# Patient Record
Sex: Male | Born: 1960
Health system: Southern US, Community
[De-identification: ages and names within clinical notes are randomized; demographics above are authoritative.]

---

## 1998-01-01 ENCOUNTER — Ambulatory Visit (HOSPITAL_COMMUNITY): Admission: RE | Admit: 1998-01-01 | Discharge: 1998-01-01 | Payer: Self-pay | Admitting: Urology

## 1998-01-01 ENCOUNTER — Encounter: Payer: Self-pay | Admitting: Urology

## 1998-04-17 ENCOUNTER — Encounter: Payer: Self-pay | Admitting: Family Medicine

## 1998-04-17 ENCOUNTER — Ambulatory Visit (HOSPITAL_COMMUNITY): Admission: RE | Admit: 1998-04-17 | Discharge: 1998-04-17 | Payer: Self-pay | Admitting: Family Medicine

## 1998-12-18 ENCOUNTER — Ambulatory Visit (HOSPITAL_COMMUNITY): Admission: RE | Admit: 1998-12-18 | Discharge: 1998-12-18 | Payer: Self-pay | Admitting: *Deleted

## 1998-12-18 ENCOUNTER — Encounter: Payer: Self-pay | Admitting: *Deleted

## 1999-04-08 ENCOUNTER — Other Ambulatory Visit: Admission: RE | Admit: 1999-04-08 | Discharge: 1999-04-08 | Payer: Self-pay | Admitting: Family Medicine

## 2002-02-20 HISTORY — PX: KNEE ARTHROSCOPY: SUR90

## 2002-08-01 ENCOUNTER — Encounter: Payer: Self-pay | Admitting: Occupational Medicine

## 2002-08-01 ENCOUNTER — Encounter: Admission: RE | Admit: 2002-08-01 | Discharge: 2002-08-01 | Payer: Self-pay | Admitting: Occupational Medicine

## 2004-02-16 ENCOUNTER — Ambulatory Visit (HOSPITAL_COMMUNITY): Admission: RE | Admit: 2004-02-16 | Discharge: 2004-02-16 | Payer: Self-pay | Admitting: Orthopedic Surgery

## 2004-03-22 ENCOUNTER — Ambulatory Visit (HOSPITAL_COMMUNITY): Admission: RE | Admit: 2004-03-22 | Discharge: 2004-03-22 | Payer: Self-pay | Admitting: Gastroenterology

## 2004-03-31 ENCOUNTER — Emergency Department (HOSPITAL_COMMUNITY): Admission: EM | Admit: 2004-03-31 | Discharge: 2004-03-31 | Payer: Self-pay | Admitting: Emergency Medicine

## 2010-02-20 HISTORY — PX: HAND SURGERY: SHX662

## 2010-03-13 ENCOUNTER — Encounter: Payer: Self-pay | Admitting: Family Medicine

## 2010-04-13 ENCOUNTER — Emergency Department (HOSPITAL_COMMUNITY): Payer: BC Managed Care – PPO

## 2010-04-13 ENCOUNTER — Emergency Department (HOSPITAL_COMMUNITY)
Admission: EM | Admit: 2010-04-13 | Discharge: 2010-04-13 | Disposition: A | Discharge status: Home / Self Care | Payer: BC Managed Care – PPO | Attending: Emergency Medicine | Admitting: Emergency Medicine

## 2010-04-13 DIAGNOSIS — S93409A Sprain of unspecified ligament of unspecified ankle, initial encounter: Secondary | ICD-10-CM | POA: Insufficient documentation

## 2010-04-13 DIAGNOSIS — X500XXA Overexertion from strenuous movement or load, initial encounter: Secondary | ICD-10-CM | POA: Insufficient documentation

## 2013-08-12 ENCOUNTER — Encounter: Payer: Self-pay | Admitting: Emergency Medicine

## 2013-08-12 ENCOUNTER — Encounter (HOSPITAL_COMMUNITY): Payer: Self-pay | Admitting: Emergency Medicine

## 2013-08-12 ENCOUNTER — Emergency Department (HOSPITAL_COMMUNITY): Payer: BC Managed Care – PPO

## 2013-08-12 ENCOUNTER — Emergency Department (HOSPITAL_COMMUNITY)
Admission: EM | Admit: 2013-08-12 | Discharge: 2013-08-12 | Disposition: A | Discharge status: Home / Self Care | Payer: BC Managed Care – PPO | Attending: Emergency Medicine | Admitting: Emergency Medicine

## 2013-08-12 ENCOUNTER — Ambulatory Visit (INDEPENDENT_AMBULATORY_CARE_PROVIDER_SITE_OTHER): Payer: BC Managed Care – PPO | Admitting: Emergency Medicine

## 2013-08-12 VITALS — BP 143/72 | HR 66 | Temp 97.7°F

## 2013-08-12 DIAGNOSIS — R079 Chest pain, unspecified: Secondary | ICD-10-CM | POA: Insufficient documentation

## 2013-08-12 DIAGNOSIS — R110 Nausea: Secondary | ICD-10-CM

## 2013-08-12 DIAGNOSIS — I200 Unstable angina: Secondary | ICD-10-CM

## 2013-08-12 LAB — PROTIME-INR
INR: 1.09 (ref 0.00–1.49)
PROTHROMBIN TIME: 13.9 s (ref 11.6–15.2)

## 2013-08-12 LAB — COMPREHENSIVE METABOLIC PANEL WITH GFR
ALT: 16 U/L (ref 0–53)
AST: 18 U/L (ref 0–37)
Albumin: 3.6 g/dL (ref 3.5–5.2)
Alkaline Phosphatase: 49 U/L (ref 39–117)
BUN: 15 mg/dL (ref 6–23)
CO2: 25 meq/L (ref 19–32)
Calcium: 8.7 mg/dL (ref 8.4–10.5)
Chloride: 107 meq/L (ref 96–112)
Creatinine, Ser: 1.1 mg/dL (ref 0.50–1.35)
GFR calc Af Amer: 87 mL/min — ABNORMAL LOW
GFR calc non Af Amer: 75 mL/min — ABNORMAL LOW
Glucose, Bld: 95 mg/dL (ref 70–99)
Potassium: 4.2 meq/L (ref 3.7–5.3)
Sodium: 143 meq/L (ref 137–147)
Total Bilirubin: 0.5 mg/dL (ref 0.3–1.2)
Total Protein: 6.4 g/dL (ref 6.0–8.3)

## 2013-08-12 LAB — GLUCOSE, POCT (MANUAL RESULT ENTRY): POC GLUCOSE: 152 mg/dL — AB (ref 70–99)

## 2013-08-12 LAB — I-STAT TROPONIN, ED
Troponin i, poc: 0 ng/mL (ref 0.00–0.08)
Troponin i, poc: 0 ng/mL (ref 0.00–0.08)

## 2013-08-12 LAB — CBC
HCT: 39.7 % (ref 39.0–52.0)
Hemoglobin: 13.8 g/dL (ref 13.0–17.0)
MCH: 30.5 pg (ref 26.0–34.0)
MCHC: 34.8 g/dL (ref 30.0–36.0)
MCV: 87.8 fL (ref 78.0–100.0)
Platelets: 243 10*3/uL (ref 150–400)
RBC: 4.52 MIL/uL (ref 4.22–5.81)
RDW: 12.6 % (ref 11.5–15.5)
WBC: 9.3 10*3/uL (ref 4.0–10.5)

## 2013-08-12 LAB — APTT: aPTT: 29 seconds (ref 24–37)

## 2013-08-12 MED ORDER — NITROGLYCERIN 0.3 MG SL SUBL
0.4000 mg | SUBLINGUAL_TABLET | Freq: Once | SUBLINGUAL | Status: AC
  Administered 2013-08-12: 0.3 mg via SUBLINGUAL

## 2013-08-12 MED ORDER — ONDANSETRON 4 MG PO TBDP
4.0000 mg | ORAL_TABLET | Freq: Once | ORAL | Status: AC
  Administered 2013-08-12: 4 mg via ORAL

## 2013-08-12 MED ORDER — ASPIRIN 81 MG PO CHEW
81.0000 mg | CHEWABLE_TABLET | Freq: Once | ORAL | Status: AC
  Administered 2013-08-12: 81 mg via ORAL

## 2013-08-12 MED ORDER — ASPIRIN 81 MG PO CHEW
81.0000 mg | CHEWABLE_TABLET | Freq: Once | ORAL | Status: AC
Start: 2013-08-12 — End: 2013-08-12
  Administered 2013-08-12: 81 mg via ORAL

## 2013-08-12 MED ORDER — SODIUM CHLORIDE 0.9 % IV SOLN
1000.0000 mL | INTRAVENOUS | Status: DC
  Administered 2013-08-12: 1000 mL via INTRAVENOUS

## 2013-08-12 NOTE — ED Notes (Signed)
Pt verbalizes understanding of d/c instructions and denies any further needs at this time. 

## 2013-08-12 NOTE — ED Notes (Addendum)
Per EMS: Pt reports constant left sided chest pain that gets better and worse, but never completley goes away x months. Today at work pt had pain radiate to left jaw and left arm for the first time. Pt reports dizziness, nausea and generalized weakness. Pt given 2 nitro, 325 aspirin, 1 liter of fluid. Pt now chest pain, but reports continued fatigue. AO x4. VSS. NSR.

## 2013-08-12 NOTE — Discharge Instructions (Signed)

## 2013-08-12 NOTE — ED Notes (Signed)
Md Tomi Bamberger at bedside.

## 2013-08-12 NOTE — ED Provider Notes (Signed)
CSN: [id number]     Arrival date & time 08/12/13  1341 History   First MD Initiated Contact with Patient 08/12/13 1341     Chief Complaint  Patient presents with  . Chest Pain  . Fatigue    HPI Pt has been having pain in his chest for months.  The episodes are intermittent.  Last 20-30 minutes.  He would just continue his work and never thought much of it.  It is a burning in the center of his chest.   No shortness of breath. No diaphoresis.  Pt works hard every day.  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  Nothing in particular brings it on.  Sometimes he gets the pain while working, sometimes he doesn't.  He does find that being in the heat makes it worse. Today he had an episode and he felt very dizzy and lightheaded.  The symptoms have resolved now.  He did have pain in his neck and arm when this occurred.  It lasted maybe for seconds.  He was seen at an urgent care and was given asa and ntg which helped.    He was working in the heat when this happened. No history of heart or lung disease.   Mom has had cardiac stents but this occurred in her 35s. History reviewed. No pertinent past medical history. History reviewed. No pertinent past surgical history. Family History  Problem Relation Age of Onset  . Asthma Father    History  Substance Use Topics  . Smoking status: Never Smoker   . Smokeless tobacco: Not on file  . Alcohol Use: No    Review of Systems  All other systems reviewed and are negative.     Allergies  Review of patient's allergies indicates no known allergies.  Home Medications   Prior to Admission medications   Not on File   BP 126/83  Pulse 59  Temp(Src) 97.7 F (36.5 C) (Oral)  Resp 16  Ht 6' (1.829 m)  Wt 220 lb (99.791 kg)  BMI 29.83 kg/m2  SpO2 100% Physical Exam  Nursing note and vitals reviewed. Constitutional: He appears well-developed and well-nourished. No distress.  HENT:  Head: Normocephalic and atraumatic.   Right Ear: External ear normal.  Left Ear: External ear normal.  Eyes: Conjunctivae are normal. Right eye exhibits no discharge. Left eye exhibits no discharge. No scleral icterus.  Neck: Neck supple. No tracheal deviation present.  Cardiovascular: Normal rate, regular rhythm and intact distal pulses.   Pulmonary/Chest: Effort normal and breath sounds normal. No stridor. No respiratory distress. He has no wheezes. He has no rales.  Abdominal: Soft. Bowel sounds are normal. He exhibits no distension. There is no tenderness. There is no rebound and no guarding.  Musculoskeletal: He exhibits no edema and no tenderness.  Neurological: He is alert. He has normal strength. No cranial nerve deficit (no facial droop, extraocular movements intact, no slurred speech) or sensory deficit. He exhibits normal muscle tone. He displays no seizure activity. Coordination normal.  Skin: Skin is warm and dry. No rash noted.  Psychiatric: He has a normal mood and affect.    ED Course  Procedures (including critical care time) Labs Review Labs Reviewed  COMPREHENSIVE METABOLIC PANEL - Abnormal; Notable for the following:    GFR calc non Af Amer 75 (*)    GFR calc Af Amer 87 (*)    All other components within normal limits  APTT  CBC  PROTIME-INR  Randolm Idol, ED  Randolm Idol, ED    Imaging Review Dg Chest Portable 1 View  08/12/2013   CLINICAL DATA:  Chest pain.  Fatigue.  EXAM: PORTABLE CHEST - 1 VIEW  COMPARISON:  06/17/2008.  FINDINGS: Cardiopericardial silhouette within normal limits. Mediastinal contours normal. Trachea midline. No airspace disease or effusion. Monitoring leads project over the chest.  IMPRESSION: No active disease.   Electronically Signed   By: Dereck Ligas M.D.   On: 08/12/2013 14:33     EKG Interpretation   Date/Time:  Tuesday August 12 2013 13:47:24 EDT Ventricular Rate:  60 PR Interval:  176 QRS Duration: 106 QT Interval:  417 QTC Calculation: 417 R Axis:    -4 Text Interpretation:  Sinus rhythm No old tracing to compare Confirmed by  KNAPP  MD-J, JON ([id number]) on 08/12/2013 2:00:46 PM      MDM   Final diagnoses:  Chest pain, unspecified chest pain type    Pt has been having intermittent chest pain for months.  TIMI score 0.  No family history of CAD.   Pt has a normal EKG and 2 sets of normal cardiac markers.  Outpatient follow up is reasonable and I do not feel he requires admission. Symptoms today may have been partly related to the heat. Discussed the findings with the patient and spouse.    Dorie Rank, MD 08/12/13 [phone]

## 2013-08-12 NOTE — Progress Notes (Signed)
[facility] [address] [place] [address] [phone]  Date:  08/12/2013   Name:  Timothy Mayer   DOB:  09/22/1960   MRN:  [id number]  PCP:  No primary Yiannis Tulloch on file.    Chief Complaint: No chief complaint on file.   History of Present Illness:  [name] is a 53 y.o. very pleasant male patient who presents with the following:  Patient presents via POV with chest pain and dizziness that began while he was working in the attic of a house.  Has prior history of sun stroke in past.  Drinks 2 gallons of water daily.  Chest tightness present since 1030-1100 and continuous and radiates into the left neck and left fingers.  Nauseated.  No vomiting or shortness of breath.  Has prior history of epigastric pain and heartburn over the past four days that waxes and wanes.  No medication for that.  Non smoker.  No history of DM or HBP.  Does have HLD.  The chest tightness with radiation is new today.   No exertional chest pressure or shortness of breath or peripheral edema.  Denies other complaint or health concern today.   There are no active problems to display for this patient.   No past medical history on file.  No past surgical history on file.  History  Substance Use Topics  . Smoking status: Never Smoker   . Smokeless tobacco: Not on file  . Alcohol Use: Not on file    No family history on file.  Allergies not on file  Medication list has been reviewed and updated.  No current outpatient prescriptions on file prior to visit.   No current facility-administered medications on file prior to visit.    Review of Systems:  As per HPI, otherwise negative.    Physical Examination: There were no vitals filed for this visit. There were no vitals filed for this visit. There is no height or weight on file to calculate BMI. Ideal Body Weight:    GEN: WDWN, ill appearing, Non-toxic, A & O x 3 HEENT: Atraumatic, Normocephalic. Neck supple. No masses,  No LAD. Ears and Nose: No external deformity. CV: RRR, No M/G/R. No JVD. No thrill. No extra heart sounds. PULM: CTA B, no wheezes, crackles, rhonchi. No retractions. No resp. distress. No accessory muscle use. ABD: S, NT, ND, +BS. No rebound. No HSM. EXTR: No c/c/e NEURO Normal gait.  PSYCH: Normally interactive. Conversant. Not depressed or anxious appearing.  Calm demeanor.    Assessment and Plan:  Chest pain likely unstable angina. Pain relief with 2 x NTG. To ER via EMS Vital signs stable No evidence to support heat hyperpyrexia   I reviewed the nurses notes; family, social, and past medical history but this note was started prior to initiation of this documentation so it does not appear.  Signed,  Ellison Carwin, MD

## 2013-08-15 ENCOUNTER — Encounter (HOSPITAL_COMMUNITY): Payer: Self-pay | Admitting: Emergency Medicine

## 2013-09-17 ENCOUNTER — Ambulatory Visit (INDEPENDENT_AMBULATORY_CARE_PROVIDER_SITE_OTHER): Payer: BC Managed Care – PPO | Admitting: Cardiovascular Disease

## 2013-09-17 ENCOUNTER — Encounter: Payer: Self-pay | Admitting: Cardiovascular Disease

## 2013-09-17 VITALS — BP 100/80 | HR 62 | Ht 72.0 in | Wt 224.6 lb

## 2013-09-17 DIAGNOSIS — R42 Dizziness and giddiness: Secondary | ICD-10-CM

## 2013-09-17 DIAGNOSIS — R079 Chest pain, unspecified: Secondary | ICD-10-CM

## 2013-09-17 NOTE — Assessment & Plan Note (Addendum)
And presents with symptoms of dizziness. These typically occur after he's been working out a lot some. He drinks lots of water but does not have any breakfast or lunch typically. I suspect it is becoming volume depleted because of his lack of electrolytes and protein. He does eat a big meal at night and his wife probably has lots of salt to this according to him.  He would do better to eat some extra sodium and potassium through the day. I've also asked him to increase his protein today. We'll have him get sludgelike tablet stat was water. I've advised him to start eating carbohydrates in the morning released the egg whites.  He can also a eat energy bars/protein bars and drink Gatorade although Gatorade  tends to be a little too sweet  toward the end of the day.  Ill see him in 2 months  I do not think any of his symptoms are related to acute coronary syndrome.  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

## 2013-09-17 NOTE — Progress Notes (Signed)
     Timothy Mayer Date of Birth  November 15, 1960       [place] [address], [address], [place], [place]  [address]   [place], [place]  [address] [address]   Fax  [phone]     Fax [phone]  Problem List: 1. Chest discomfort 2. Hyperlipidemia  History of Present Illness:  Timothy Mayer is a 53 yo - has had CP off and on for a while.   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    He had what probably was a heat stroke - had slurred speech while working out in the sun.  He walks at work ( 7 miles a day) .  He works his farm - raises horses, garden.  Then works 10-12 hours at [employer]ating and [employer] and then returns to his farm to work until [employer].    He does not have any angina with all of his farm and garden work.    + family hx of CAD ( mother, Timothy Mayer)    No current outpatient prescriptions on file prior to visit.   No current facility-administered medications on file prior to visit.    No Known Allergies  No past medical history on file.  No past surgical history on file.  History  Smoking status  . Never Smoker   Smokeless tobacco  . Not on file    History  Alcohol Use No    Family History  Problem Relation Age of Onset  . Asthma Father     Reviw of Systems:  Reviewed in the HPI.  All other systems are negative.  Physical Exam: Blood pressure 100/80, pulse 62, height 6' (1.829 m), weight 224 lb 9.6 oz (101.878 kg). Wt Readings from Last 3 Encounters:  09/17/13 224 lb 9.6 oz (101.878 kg)  08/12/13 220 lb (99.791 kg)     General: Well developed, well nourished, in no acute distress.  Head: Normocephalic, atraumatic, sclera non-icteric, mucus membranes are moist,   Neck: Supple. Carotids are 2 + without bruits. No JVD   Lungs: Clear   Heart: RR, normal  S1S2  Abdomen: Soft, non-tender, non-distended with normal bowel sounds.  Msk:  Strength  and tone are normal   Extremities: No clubbing or cyanosis. No edema.  Distal pedal pulses are 2+ and equal    Neuro: CN II - XII intact.  Alert and oriented X 3.   Psych:  Normal   ECG: September 17, 2013:  NSR at 71.  Normal ECG.  Assessment / Plan:

## 2013-09-17 NOTE — Patient Instructions (Addendum)
Increase your intake of fluids (water with electrolyte tabs like Nun tablets, or gatorade) , protein ( hard boiled eggs, chicken, fish) , and a electrolytes ( V-8 juice, salt, potassium chloride  which is sold as No-Salt  Your physician recommends that you schedule a follow-up appointment in: 2 months with Dr. Acie Fredrickson

## 2013-11-17 ENCOUNTER — Ambulatory Visit: Payer: BC Managed Care – PPO | Admitting: Cardiovascular Disease

## 2014-02-20 HISTORY — PX: KNEE ARTHROSCOPY: SUR90

## 2014-05-19 ENCOUNTER — Ambulatory Visit: Payer: Self-pay

## 2014-05-21 ENCOUNTER — Ambulatory Visit (INDEPENDENT_AMBULATORY_CARE_PROVIDER_SITE_OTHER): Payer: BLUE CROSS/BLUE SHIELD | Admitting: Podiatry

## 2014-05-21 ENCOUNTER — Encounter: Payer: Self-pay | Admitting: Podiatry

## 2014-05-21 VITALS — BP 126/83 | HR 58 | Resp 15

## 2014-05-21 DIAGNOSIS — M779 Enthesopathy, unspecified: Secondary | ICD-10-CM | POA: Diagnosis not present

## 2014-05-21 NOTE — Progress Notes (Signed)
Subjective:     Patient ID: Timothy Mayer, male   DOB: [birth year], 54 y.o.   MRN: [id number]  HPI patient states his feet are starting to ache more and his orthotics are not appropriately: Of his arches. States it's gradually getting worse   Review of Systems  All other systems reviewed and are negative.      Objective:   Physical Exam  Constitutional: He is oriented to person, place, and time.  Cardiovascular: Intact distal pulses.   Musculoskeletal: Normal range of motion.  Neurological: He is oriented to person, place, and time.  Skin: Skin is warm.  Nursing note and vitals reviewed.  neurovascular status intact with muscle strength adequate and range of motion subtalar midtarsal joint within normal limits. Patient's noted to have discomfort in the mid arch area bilateral secondary to depression of the arch     Assessment:     Tendinitis-like condition    Plan:     Reviewed physical therapy and reviewed the structure of his foot. Patient wants to have this treated and today I went ahead and I placed him into scanning and have him scanned for custom orthotic devices

## 2014-05-21 NOTE — Progress Notes (Signed)
   Subjective:    Patient ID: Timothy Mayer, male    DOB: Sep 07, 1960, 54 y.o.   MRN: [id number]  HPI Pt presents stating that he wants new orthotics his are worn out, his feet ache at the end of the day   Review of Systems  All other systems reviewed and are negative.      Objective:   Physical Exam        Assessment & Plan:

## 2014-05-26 ENCOUNTER — Ambulatory Visit: Payer: Self-pay

## 2014-06-12 ENCOUNTER — Ambulatory Visit: Payer: BLUE CROSS/BLUE SHIELD | Admitting: *Deleted

## 2014-06-12 VITALS — BP 119/70 | HR 70 | Resp 18

## 2014-06-12 DIAGNOSIS — M779 Enthesopathy, unspecified: Secondary | ICD-10-CM

## 2014-06-12 NOTE — Progress Notes (Signed)
Patient ID: Timothy Mayer, male   DOB: [birth year], 54 y.o.   MRN: [id number] Picking up inserts

## 2014-06-12 NOTE — Patient Instructions (Signed)

## 2015-06-16 ENCOUNTER — Ambulatory Visit (INDEPENDENT_AMBULATORY_CARE_PROVIDER_SITE_OTHER): Payer: 59 | Admitting: Podiatry

## 2015-06-16 ENCOUNTER — Encounter: Payer: Self-pay | Admitting: Podiatry

## 2015-06-16 ENCOUNTER — Ambulatory Visit (INDEPENDENT_AMBULATORY_CARE_PROVIDER_SITE_OTHER): Payer: 59

## 2015-06-16 VITALS — BP 124/81 | HR 66 | Resp 16

## 2015-06-16 DIAGNOSIS — M79671 Pain in right foot: Secondary | ICD-10-CM

## 2015-06-16 DIAGNOSIS — M79672 Pain in left foot: Secondary | ICD-10-CM

## 2015-06-16 DIAGNOSIS — M779 Enthesopathy, unspecified: Secondary | ICD-10-CM | POA: Diagnosis not present

## 2015-06-16 MED ORDER — TRIAMCINOLONE ACETONIDE 10 MG/ML IJ SUSP
10.0000 mg | Freq: Once | INTRAMUSCULAR | Status: AC
  Administered 2015-06-16: 10 mg

## 2015-06-16 NOTE — Progress Notes (Signed)
Subjective:     Patient ID: Timothy Mayer, male   DOB: 1960-08-15, 55 y.o.   MRN: [id number]  HPI patient presents stating he's having pain in his forefoot left and he's not sure if he needs new orthotics as they are starting to wear out   Review of Systems     Objective:   Physical Exam Neurovascular status intact muscle strength adequate with inflammation around the third metatarsophalangeal joint left forefoot discomfort bilateral and mild heel pain bilateral    Assessment:     Inflammatory capsulitis third MPJ left with increased stress against the forefoot bilateral and mild heel pain bilateral    Plan:     H&P and conditions reviewed with patient. Today I did a proximal nerve block left aspirated the third MPJ getting out of small amount of clear fluid and injected with a quarter cc of dexamethasone Kenalog and applied thick padding to reduce pressure on the joints. Instructed on physical therapy and we will consider new orthotics at next visit depending on response to this initial treatment

## 2015-06-16 NOTE — Patient Instructions (Signed)

## 2015-07-07 ENCOUNTER — Encounter: Payer: Self-pay | Admitting: Podiatry

## 2015-07-07 ENCOUNTER — Ambulatory Visit (INDEPENDENT_AMBULATORY_CARE_PROVIDER_SITE_OTHER): Payer: 59 | Admitting: Podiatry

## 2015-07-07 DIAGNOSIS — M779 Enthesopathy, unspecified: Secondary | ICD-10-CM | POA: Diagnosis not present

## 2015-07-07 NOTE — Progress Notes (Signed)
Subjective:     Patient ID: Timothy Mayer, male   DOB: [birth year], 55 y.o.   MRN: [id number]  HPI patient states my feet feel quite a bit better but I'm sure I'm getting need new orthotics   Review of Systems     Objective:   Physical Exam Neurovascular status intact muscle strength adequate with diminishment of discomfort third metatarsophalangeal joint left with inflammation fluid that has reduced quite a bit. Patient is walking with a better heel toe gait    Assessment:     Inflammatory capsulitis left over right that has improved with orthotics which have thin down quite a bit    Plan:     Reviewed condition and continued padding usage and not going barefoot and scanned for a new type of orthotic to reduce plantar stress on the feet

## 2016-04-20 ENCOUNTER — Ambulatory Visit (INDEPENDENT_AMBULATORY_CARE_PROVIDER_SITE_OTHER): Payer: 59

## 2016-04-20 ENCOUNTER — Encounter: Payer: Self-pay | Admitting: Podiatry

## 2016-04-20 ENCOUNTER — Ambulatory Visit (INDEPENDENT_AMBULATORY_CARE_PROVIDER_SITE_OTHER): Payer: 59 | Admitting: Podiatry

## 2016-04-20 VITALS — BP 124/85 | HR 79 | Resp 16

## 2016-04-20 DIAGNOSIS — M79672 Pain in left foot: Secondary | ICD-10-CM

## 2016-04-20 DIAGNOSIS — D3610 Benign neoplasm of peripheral nerves and autonomic nervous system, unspecified: Secondary | ICD-10-CM | POA: Diagnosis not present

## 2016-04-20 DIAGNOSIS — M779 Enthesopathy, unspecified: Secondary | ICD-10-CM | POA: Diagnosis not present

## 2016-04-20 NOTE — Patient Instructions (Signed)
Morton Neuralgia Morton neuralgia is a type of foot pain in the area closest to your toes. This area is sometimes called the ball of your foot. Morton neuralgia occurs when a branch of a nerve in your foot (digital nerve) becomes compressed. When this happens over a long period of time, the nerve can thicken (neuroma) and cause pain. This usually occurs between the third and fourth toe. Morton neuralgia can come and go but may get worse over time. What are the causes? Your digital nerve can become compressed and stretched at a point where it passes under a thick band of tissue that connects your toes (intermetatarsal ligament). Morton neuralgia can be caused by mild repetitive damage in this area. This type of damage can result from:  Activities such as running or jumping.  Wearing shoes that are too tight. What increases the risk? You may be at risk for Morton neuralgia if you:  Are male.  Wear high heels.  Wear shoes that are narrow or tight.  Participate in activities that stretch your toes. These include:  Running.  Ballet.  Long-distance walking. What are the signs or symptoms? The first symptom of Morton neuralgia is pain that spreads from the ball of your foot to your toes. It may feel like you are walking on a marble. Pain usually gets worse with walking and goes away at night. Other symptoms may include numbness and cramping of your toes. How is this diagnosed? Your health care provider will do a physical exam. When doing the exam, your health care provider may:  Squeeze your foot just behind your toe.  Ask you to move your toes to check for pain. You may also have tests on your foot to confirm the diagnosis. These may include:  An X-ray.  An MRI. How is this treated? Treatment for Morton neuralgia may be as simple as changing the kind of shoes you wear. Other treatments may include:  Wearing a supportive pad (orthosis) under the front of your foot. This lifts your  toe bones and takes pressure off the nerve.  Getting injections of numbing medicine and anti-inflammatory medicine (steroid) in the nerve.  Having surgery to remove part of the thickened nerve. Follow these instructions at home:  Take medicine only as directed by your health care provider.  Wear soft-soled shoes with a wide toe area.  Stop activities that may be causing pain.  Elevate your foot when resting.  Massage your foot.  Apply ice to the injured area:  Put ice in a plastic bag.  Place a towel between your skin and the bag.  Leave the ice on for 20 minutes, 2-3 times a day.  Keep all follow-up visits as directed by your health care provider. This is important. Contact a health care provider if:  Home care instructions are not helping you get better.  Your symptoms change or get worse. This information is not intended to replace advice given to you by your health care provider. Make sure you discuss any questions you have with your health care provider. Document Released: 05/15/2000 Document Revised: 07/15/2015 Document Reviewed: 04/09/2013 Elsevier Interactive Patient Education  2017 [employer]  

## 2016-04-20 NOTE — Progress Notes (Signed)
Subjective:     Patient ID: Timothy Mayer, male   DOB: [birth year], 56 y.o.   MRN: [id number]  HPI patient states she's getting a lot of shooting pains in his left foot and admits since I treated him last time it still been very tender   Review of Systems     Objective:   Physical Exam Neurovascular status intact with radiating discomfort with positive Biagio Borg sign and nodule third interspace left with patient also having mild to moderate discomfort around the third metatarsophalangeal joint left    Assessment:     Strong possibility for neuroma with also probability of inflammatory capsulitis left    Plan:     H&P conditions reviewed and at this time I'm get a focus on neuroma possible symptomatology. I did do a sterile prep of the left forefoot and then injected the third interspace with a purified alcohol Marcaine solution and advised on capsulitis and continued orthotic usage. Reappoint 2 weeks and may require excision depending on response  X-ray dated today was negative for signs of fracture or arthritic issues

## 2016-05-04 ENCOUNTER — Ambulatory Visit (INDEPENDENT_AMBULATORY_CARE_PROVIDER_SITE_OTHER): Payer: 59 | Admitting: Podiatry

## 2016-05-04 DIAGNOSIS — M779 Enthesopathy, unspecified: Secondary | ICD-10-CM

## 2016-05-04 DIAGNOSIS — D3610 Benign neoplasm of peripheral nerves and autonomic nervous system, unspecified: Secondary | ICD-10-CM | POA: Diagnosis not present

## 2016-05-04 MED ORDER — PREDNISONE 10 MG PO TABS: ORAL_TABLET | ORAL | 0 refills | Status: DC

## 2016-05-05 NOTE — Progress Notes (Signed)
Subjective:     Patient ID: Timothy Mayer, male   DOB: 03-Mar-1960, 56 y.o.   MRN: [id number]  HPI patient states he continues to have discomfort in his left foot was swelling around the metatarsal phalangeal joint third and continued positive Mulder sign third interspace left foot stating it does hurt in the morning and then after he's been on it for periods time   Review of Systems     Objective:   Physical Exam Neurovascular status intact negative Homans sign was noted with exquisite discomfort third MPJ left with also discomfort in the third interspace with a popping-like sensation and pain when the third interspace is palpated with obvious soft tissue abnormality    Assessment:     Very difficult to determine between acute inflammatory capsulitis versus neuroma symptomatology or a combination of the 2    Plan:     H&P conditions reviewed and we will continue to try conservative care. I did go ahead today and I dispensed air fracture walker to completely eliminate pressure on the forefoot and I placed him on it 12 a steroid pack. Patient will be seen back in 3 weeks and may require surgical intervention with shortening-type osteotomy

## 2016-05-29 ENCOUNTER — Ambulatory Visit (INDEPENDENT_AMBULATORY_CARE_PROVIDER_SITE_OTHER): Payer: 59 | Admitting: Podiatry

## 2016-05-29 ENCOUNTER — Encounter: Payer: Self-pay | Admitting: Podiatry

## 2016-05-29 DIAGNOSIS — D3610 Benign neoplasm of peripheral nerves and autonomic nervous system, unspecified: Secondary | ICD-10-CM

## 2016-05-29 DIAGNOSIS — M779 Enthesopathy, unspecified: Secondary | ICD-10-CM | POA: Diagnosis not present

## 2016-05-29 NOTE — Progress Notes (Signed)
Subjective:     Patient ID: Timothy Mayer, male   DOB: 22-Jun-1960, 56 y.o.   MRN: [id number]  HPI patient presents stating he is feeling some better and the boot has definitely relieve some of the pressure off his foot   Review of Systems     Objective:   Physical Exam Neurovascular status intact with patient's MPJ improved from previous with the patient still having a positive Mulder sign when I pressed into the interspace with a palpable mass    Assessment:     Continues to be difficult to differentiate and inflammatory capsulitis versus the possibility for neuroma symptomatology.    Plan:     Explained condition and the continuation of immobilization. He will use the boot and over the next 4 weeks gradually reduce it along with ice therapy and elevation. Patient will be seen back to recheck

## 2016-06-26 ENCOUNTER — Ambulatory Visit: Payer: 59 | Admitting: Podiatry

## 2016-07-11 ENCOUNTER — Ambulatory Visit (INDEPENDENT_AMBULATORY_CARE_PROVIDER_SITE_OTHER): Payer: 59

## 2016-07-11 ENCOUNTER — Ambulatory Visit (INDEPENDENT_AMBULATORY_CARE_PROVIDER_SITE_OTHER): Payer: 59 | Admitting: Orthopaedic Surgery

## 2016-07-11 ENCOUNTER — Encounter (INDEPENDENT_AMBULATORY_CARE_PROVIDER_SITE_OTHER): Payer: Self-pay | Admitting: Orthopaedic Surgery

## 2016-07-11 VITALS — BP 129/81 | HR 81 | Resp 14 | Ht 72.0 in | Wt 220.0 lb

## 2016-07-11 DIAGNOSIS — M5412 Radiculopathy, cervical region: Secondary | ICD-10-CM

## 2016-07-11 DIAGNOSIS — M5442 Lumbago with sciatica, left side: Secondary | ICD-10-CM

## 2016-07-11 DIAGNOSIS — G8929 Other chronic pain: Secondary | ICD-10-CM

## 2016-07-11 DIAGNOSIS — M542 Cervicalgia: Secondary | ICD-10-CM

## 2016-07-11 NOTE — Progress Notes (Signed)
Office Visit Note   Patient: Timothy Mayer           Date of Birth: 11/13/1960           MRN: [id number] Visit Date: 07/11/2016              Requested by: No referring provider defined for this encounter. PCP: Patient, No Pcp Per   Assessment & Plan: Visit Diagnoses:  1. Chronic left-sided low back pain with left-sided sciatica   2. Chronic neck pain   3. Cervical radiculopathy     Plan:  #1: He states he is unable to do any diagnostic testing even a CT scan causes him to be claustrophobic #2: He does have an exercise program and we've asked him to do these exercises #3: [facility] is already been treating him and he may follow back up with them.  Follow-Up Instructions: Return if symptoms worsen or fail to improve.   Orders:  Orders Placed This Encounter  Procedures  . XR Cervical Spine 2 or 3 views  . XR Lumbar Spine Complete W/Bend   No orders of the defined types were placed in this encounter.     Procedures: No procedures performed   Clinical Data: No additional findings.   Subjective: Chief Complaint  Patient presents with  . Lower Back - Pain    Timothy Mayer is a 56 year old white male seen today at the request of his wife for evaluation of back pain. He has had a prolonged history of problems in the past dating back 18 years ago when he had fallen off of a horse. At that time he had x-rays performed one day post injury and that revealed pars defect at L5 and S1 without spondylolisthesis. There was anterior compression of T12 and L1 which appeared to be old. This had been seen back on an IVP study in 01/01/1998. He also had another fall after this. He had been seen by [facility] and at had 2 injections in the lumbar spine. This was without MRI scan because of his significant claustrophobia. He could not even tolerate a CT scan. He states was the injections did have a benefit the second one did not. He continues to have back pain that does  have some radiation to the left lateral aspect of the thigh. Denies any weakness.  He also complains of cervical spine pain with some radiation down into the left shoulder. His symptoms are worsened with rotation to the left. He does have burning pain in both arms. He states he is now having numbness also in both arms. Apparently his had this problem of pruritus and the dermatologist cannot figure this out and has been burning his old age spots. This has not been beneficial. He states he knows there is not much that can be done especially without having CT/MRI scans but is here because his wife is insisted.  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 He does not do any office type work from that standpoint.    Review of Systems  All other systems reviewed and are negative.    Objective: Vital Signs: BP 129/81   Pulse 81   Resp 14   Ht 6' (1.829 m)   Wt 220 lb (99.8 kg)   BMI 29.84 kg/m   Physical Exam  Constitutional: He is oriented to person, place, and time. He appears well-developed and well-nourished.  HENT:  Head: Normocephalic and atraumatic.  Eyes: EOM are normal. Pupils are equal,  round, and reactive to light.  Pulmonary/Chest: Effort normal.  Neurological: He is alert and oriented to person, place, and time.  Skin: Skin is warm and dry.  Psychiatric: He has a normal mood and affect. His behavior is normal. Judgment and thought content normal.    Ortho Exam  Lumbar spine reveals little bit of tenderness over the lower left side of the lumbar spine. He has good motion of both hips. Pain. Negative straight leg raising bilaterally. Deep tendon reflexes are absent in the lower extremity bilateral and symmetric. Good strength though throughout.  Cervical spine reveals decreased range of motion with left rotation. He does have some referred pain to the left side with left rotation. Backward extension does not necessarily give much in the way of symptoms into the arms. Discussed  good strength in abduction as well as internal and external rotation resistance. Good radial pulses bilaterally. Sensation is intact to light touch.  Specialty Comments:  No specialty comments available.  Imaging: Xr Lumbar Spine Complete W/bend  Result Date: 07/11/2016 Lumbar spine 6 views x-rays reveals a grade 1-2 spinal listhesis L5 on S1. He does have some mild disc space narrowing at 45. Anterior spurring 34 is noted. All compression fractures of T12 and L1 are also noted.  Xr Cervical Spine 2 Or 3 Views  Result Date: 07/11/2016 Two-view cervical spine reveals loss of cervical lordosis. Significant C6-7 degenerative disc disease with disc space narrowing. Anterior spurring C6-7. Cervical spondylosis 56-67.    PMFS History: Patient Active Problem List   Diagnosis Date Noted  . Dizziness 09/17/2013   No past medical history on file.  Family History  Problem Relation Age of Onset  . Asthma Father     Past Surgical History:  Procedure Laterality Date  . HAND SURGERY Left 2012   thumb fusion  . KNEE ARTHROSCOPY Right 2016   2 x  . KNEE ARTHROSCOPY Left 2004   Social History   Occupational History  . Not on file.   Social History Main Topics  . Smoking status: Never Smoker  . Smokeless tobacco: Never Used  . Alcohol use No  . Drug use: No  . Sexual activity: Not on file

## 2017-08-16 DIAGNOSIS — M25511 Pain in right shoulder: Secondary | ICD-10-CM | POA: Diagnosis not present

## 2017-08-16 DIAGNOSIS — M25512 Pain in left shoulder: Secondary | ICD-10-CM | POA: Diagnosis not present

## 2017-08-16 DIAGNOSIS — M67442 Ganglion, left hand: Secondary | ICD-10-CM | POA: Diagnosis not present

## 2017-11-25 DIAGNOSIS — J069 Acute upper respiratory infection, unspecified: Secondary | ICD-10-CM | POA: Diagnosis not present

## 2017-12-02 DIAGNOSIS — R109 Unspecified abdominal pain: Secondary | ICD-10-CM | POA: Diagnosis not present

## 2017-12-02 DIAGNOSIS — R61 Generalized hyperhidrosis: Secondary | ICD-10-CM | POA: Diagnosis not present

## 2017-12-02 DIAGNOSIS — I700 Atherosclerosis of aorta: Secondary | ICD-10-CM | POA: Diagnosis not present

## 2017-12-02 DIAGNOSIS — R42 Dizziness and giddiness: Secondary | ICD-10-CM | POA: Diagnosis not present

## 2018-01-21 DIAGNOSIS — R82998 Other abnormal findings in urine: Secondary | ICD-10-CM | POA: Diagnosis not present

## 2018-01-21 DIAGNOSIS — Z0000 Encounter for general adult medical examination without abnormal findings: Secondary | ICD-10-CM | POA: Diagnosis not present

## 2018-01-24 DIAGNOSIS — Z1212 Encounter for screening for malignant neoplasm of rectum: Secondary | ICD-10-CM | POA: Diagnosis not present

## 2018-01-25 DIAGNOSIS — R05 Cough: Secondary | ICD-10-CM | POA: Diagnosis not present

## 2018-01-25 DIAGNOSIS — R509 Fever, unspecified: Secondary | ICD-10-CM | POA: Diagnosis not present

## 2018-01-25 DIAGNOSIS — K219 Gastro-esophageal reflux disease without esophagitis: Secondary | ICD-10-CM | POA: Diagnosis not present

## 2018-01-25 DIAGNOSIS — Z0000 Encounter for general adult medical examination without abnormal findings: Secondary | ICD-10-CM | POA: Diagnosis not present

## 2019-01-02 ENCOUNTER — Other Ambulatory Visit: Payer: Self-pay

## 2019-01-02 ENCOUNTER — Telehealth: Payer: Self-pay | Admitting: Orthopedic Surgery

## 2019-01-02 ENCOUNTER — Other Ambulatory Visit: Payer: Self-pay | Admitting: Orthopedic Surgery

## 2019-01-02 ENCOUNTER — Ambulatory Visit: Payer: 59 | Admitting: Orthopaedic Surgery

## 2019-01-02 ENCOUNTER — Ambulatory Visit: Payer: Self-pay

## 2019-01-02 ENCOUNTER — Encounter: Payer: Self-pay | Admitting: Orthopaedic Surgery

## 2019-01-02 VITALS — Ht 72.0 in | Wt 220.0 lb

## 2019-01-02 DIAGNOSIS — M5441 Lumbago with sciatica, right side: Secondary | ICD-10-CM

## 2019-01-02 DIAGNOSIS — M4310 Spondylolisthesis, site unspecified: Secondary | ICD-10-CM | POA: Insufficient documentation

## 2019-01-02 DIAGNOSIS — M545 Low back pain, unspecified: Secondary | ICD-10-CM

## 2019-01-02 DIAGNOSIS — G8929 Other chronic pain: Secondary | ICD-10-CM

## 2019-01-02 DIAGNOSIS — M4317 Spondylolisthesis, lumbosacral region: Secondary | ICD-10-CM

## 2019-01-02 DIAGNOSIS — M47818 Spondylosis without myelopathy or radiculopathy, sacral and sacrococcygeal region: Secondary | ICD-10-CM

## 2019-01-02 DIAGNOSIS — M5136 Other intervertebral disc degeneration, lumbar region: Secondary | ICD-10-CM

## 2019-01-02 MED ORDER — METHYLPREDNISOLONE 4 MG PO TBPK: ORAL_TABLET | ORAL | 0 refills | Status: AC

## 2019-01-02 MED ORDER — OXYCODONE HCL 5 MG PO TABS: 5.0000 mg | ORAL_TABLET | Freq: Four times a day (QID) | ORAL | 0 refills | Status: AC | PRN

## 2019-01-02 MED ORDER — OXYCODONE HCL 5 MG PO CAPS: 5.0000 mg | ORAL_CAPSULE | Freq: Four times a day (QID) | ORAL | 0 refills | Status: DC | PRN

## 2019-01-02 MED ORDER — DIAZEPAM 5 MG PO TABS: 5.0000 mg | ORAL_TABLET | Freq: Four times a day (QID) | ORAL | 0 refills | Status: AC | PRN

## 2019-01-02 NOTE — Telephone Encounter (Signed)
Send a corrected prescription in to pharmacy

## 2019-01-02 NOTE — Telephone Encounter (Signed)
Received call from [facility] -pharmacist with [facility] in [place]. He advised do not have the capsules in the Oxycodone but, do have the tablets. Moses asked if the Rx need to be changed to another medication? The number to contact Gershon Mussel is [phone]

## 2019-01-02 NOTE — Telephone Encounter (Signed)
Please see below.

## 2019-01-02 NOTE — Progress Notes (Signed)
Office Visit Note   Patient: Timothy Mayer           Date of Birth: 25-Aug-1960           MRN: [id number] Visit Date: 01/02/2019              Requested by: No referring provider defined for this encounter. PCP: Patient, No Pcp Per   Assessment & Plan: Visit Diagnoses:  1. Chronic right-sided low back pain, unspecified whether sciatica present   2. Chronic right-sided low back pain with right-sided sciatica   3. Spondylolisthesis, lumbosacral region   4. Retrolisthesis   5. SI joint arthritis   6. Other intervertebral disc degeneration, lumbar region   7. Spondylolisthesis of lumbosacral region     Plan:  #1: MRI scan of the lumbar spine rule out HNP #2: We will try Dosepak to try to quiet down the inflammation #3: Prescription for oxycodone q 4 hours will be given as needed for pain #4: Valium 10 mg prior to his MRI scan.  Follow-Up Instructions: No follow-ups on file.   Face-to-face time spent with patient was greater than 40 minutes.  Greater than 50% of the time was spent in counseling and coordination of care.  Orders:  Orders Placed This Encounter  Procedures  . XR Lumbar Spine 2-3 Views  . MR Lumbar Spine w/o contrast   Meds ordered this encounter  Medications  . diazepam (VALIUM) 5 MG tablet    Sig: Take 1 tablet (5 mg total) by mouth every 6 (six) hours as needed for anxiety (prior to MRI scan).    Dispense:  2 tablet    Refill:  0    Order Specific Question:   Supervising Provider    Answer:   Garald Balding [id number]  . methylPREDNISolone (MEDROL DOSEPAK) 4 MG TBPK tablet    Sig: As directed    Dispense:  1 each    Refill:  0  . oxycodone (OXY-IR) 5 MG capsule    Sig: Take 1 capsule (5 mg total) by mouth every 6 (six) hours as needed.    Dispense:  20 capsule    Refill:  0    Order Specific Question:   Supervising Provider    Answer:   Garald Balding [id number]      Procedures: No procedures performed   Clinical Data: No additional  findings.   Subjective: Chief Complaint  Patient presents with  . Lower Back - Pain  Patient presents today with lower right sided back pain. He said that it started hurting 5 days ago. He said that the pain travels down his leg. He also has some pain in his groin. He has numbness in his lower right leg as well, along with weakness. His pain is always there, but worse with weightbearing. He is not taking anything for pain.   HPI  In addition he has had problems with his low back in the past with conservative treatment.  However this time he is having more symptoms.  He complains of numbness in his right leg.  He denies any bowel or bladder incontinence.  He feels as if his right leg is weak but has not had any problems with ambulating self strength wise.   Review of Systems  Constitutional: Negative for fatigue.  HENT: Negative for ear pain.   Eyes: Negative for pain.  Respiratory: Negative for shortness of breath.   Cardiovascular: Negative for leg swelling.  Gastrointestinal: Negative for constipation and diarrhea.  Endocrine: Negative for cold intolerance and heat intolerance.  Genitourinary: Negative for difficulty urinating.  Musculoskeletal: Negative for joint swelling.  Skin: Positive for rash.  Allergic/Immunologic: Negative for food allergies.  Neurological: Positive for weakness.  Hematological: Does not bruise/bleed easily.  Psychiatric/Behavioral: Negative for sleep disturbance.     Objective: Vital Signs: Ht 6' (1.829 m)   Wt 220 lb (99.8 kg)   BMI 29.84 kg/m   Physical Exam Constitutional:      Appearance: Normal appearance. He is well-developed.  HENT:     Head: Normocephalic and atraumatic.  Eyes:     Pupils: Pupils are equal, round, and reactive to light.  Pulmonary:     Effort: Pulmonary effort is normal.  Skin:    General: Skin is warm and dry.  Neurological:     Mental Status: He is alert and oriented to person, place, and time.  Psychiatric:         Behavior: Behavior normal.     Ortho Exam  Exam today reveals negative straight leg raising bilaterally.  He is tender to palpation more on the right side of the lumbosacral area.  Some pain at the SI joint.  He does have deep tendon reflexes there is absent in the lower extremities except in the right Achilles which is trace.  Sensation is intact to light touch.  He does have good strength in the lower extremities.  He can actually tell when he will walk.  Specialty Comments:  No specialty comments available.  Imaging: Xr Lumbar Spine 2-3 Views  Result Date: 01/02/2019 Three-view x-ray of the lumbar spine reveals degenerative changes in the SI joints bilaterally.  He does have a grade 1 spondylolisthesis L5 on S1 retrolisthesis L4 on 5 this is very mild.  He does have some spurring superior L4 vertebral body.With a request also noted to have some compression of L1 vertebral body.  There is also some degenerative changes at the symphysis pubis.  Facet degenerative changes are noted L4 through sacrum.    PMFS History: Patient Active Problem List   Diagnosis Date Noted  . Lumbago with sciatica, right side 01/02/2019  . Spondylolisthesis, lumbosacral region 01/02/2019  . Retrolisthesis 01/02/2019  . SI joint arthritis 01/02/2019  . Dizziness 09/17/2013   History reviewed. No pertinent past medical history.  Family History  Problem Relation Age of Onset  . Asthma Father     Past Surgical History:  Procedure Laterality Date  . HAND SURGERY Left 2012   thumb fusion  . KNEE ARTHROSCOPY Right 2016   2 x  . KNEE ARTHROSCOPY Left 2004   Social History   Occupational History  . Not on file  Tobacco Use  . Smoking status: Never Smoker  . Smokeless tobacco: Never Used  Substance and Sexual Activity  . Alcohol use: No  . Drug use: No  . Sexual activity: Not on file

## 2019-01-10 ENCOUNTER — Ambulatory Visit
Admission: RE | Admit: 2019-01-10 | Discharge: 2019-01-10 | Disposition: A | Discharge status: Home / Self Care | Payer: 59 | Source: Ambulatory Visit | Attending: Orthopedic Surgery | Admitting: Orthopedic Surgery

## 2019-01-10 ENCOUNTER — Other Ambulatory Visit: Payer: Self-pay

## 2019-01-10 DIAGNOSIS — M5136 Other intervertebral disc degeneration, lumbar region: Secondary | ICD-10-CM

## 2019-01-10 DIAGNOSIS — M4317 Spondylolisthesis, lumbosacral region: Secondary | ICD-10-CM

## 2019-01-23 ENCOUNTER — Ambulatory Visit: Payer: 59 | Admitting: Orthopaedic Surgery

## 2019-01-23 ENCOUNTER — Other Ambulatory Visit: Payer: Self-pay

## 2019-01-23 ENCOUNTER — Encounter: Payer: Self-pay | Admitting: Orthopaedic Surgery

## 2019-01-23 VITALS — Ht 72.0 in | Wt 220.0 lb

## 2019-01-23 DIAGNOSIS — M48061 Spinal stenosis, lumbar region without neurogenic claudication: Secondary | ICD-10-CM | POA: Insufficient documentation

## 2019-01-23 DIAGNOSIS — G8929 Other chronic pain: Secondary | ICD-10-CM

## 2019-01-23 DIAGNOSIS — M5441 Lumbago with sciatica, right side: Secondary | ICD-10-CM

## 2019-01-23 DIAGNOSIS — M48062 Spinal stenosis, lumbar region with neurogenic claudication: Secondary | ICD-10-CM

## 2019-01-23 NOTE — Progress Notes (Signed)
Office Visit Note   Patient: Timothy Mayer           Date of Birth: 1960-09-25           MRN: [id number] Visit Date: 01/23/2019              Requested by: No referring provider defined for this encounter. PCP: Patient, No Pcp Per   Assessment & Plan: Visit Diagnoses:  1. Chronic right-sided low back pain with right-sided sciatica   2. Spinal stenosis of lumbar region with neurogenic claudication     Plan:  #1: At this time we reviewed the MRI scan results with him and he is understanding of his pathology. #2: Our plan would be to try injections the lumbar spine by Dr. Ernestina Patches to see if there is any benefit derived from those. #3: Discussed with him that if this is not beneficial then he may be necessary to have surgery. #4: I had shown this to Dr. Louanne Skye who concurred with the above.  Follow-Up Instructions: Return in about 3 weeks (around 02/13/2019).   Face-to-face time spent with patient was greater than 30 minutes.  Greater than 50% of the time was spent in counseling and coordination of care.  Orders:  Orders Placed This Encounter  Procedures  . Ambulatory referral to Physical Medicine Rehab   No orders of the defined types were placed in this encounter.     Procedures: No procedures performed   Clinical Data: No additional findings.   Subjective: Chief Complaint  Patient presents with  . Spine - Follow-up    MRI review  Patient presents today for follow up on his lower back. He had an MRI on 01/10/2019 and is here today to go over those results.  HPI  Timothy Mayer is seen today for a follow-up of his low back pain and sciatic pain.  Seen several weeks ago an MRI scan was ordered.  A Dosepak was also given to him at that time.  He states only minimal beneficially from that standpoint.  He continues to have back pain with sciatic type pain more on the right side than on the left side.  No bowel or bladder symptoms at this time.  Review of Systems  Constitutional:  Negative for fatigue.  HENT: Negative for ear pain.   Eyes: Negative for pain.  Respiratory: Negative for shortness of breath.   Cardiovascular: Negative for leg swelling.  Gastrointestinal: Negative for constipation and diarrhea.  Endocrine: Negative for cold intolerance and heat intolerance.  Genitourinary: Negative for difficulty urinating.  Musculoskeletal: Negative for joint swelling.  Skin: Positive for rash.  Allergic/Immunologic: Negative for food allergies.  Neurological: Positive for weakness.  Hematological: Does not bruise/bleed easily.  Psychiatric/Behavioral: Negative for sleep disturbance.     Objective: Vital Signs: Ht 6' (1.829 m)   Wt 220 lb (99.8 kg)   BMI 29.84 kg/m   Physical Exam Constitutional:      Appearance: Normal appearance. He is normal weight.  HENT:     Head: Normocephalic.  Cardiovascular:     Pulses: Normal pulses.  Skin:    General: Skin is warm and dry.  Neurological:     Mental Status: He is alert and oriented to person, place, and time.  Psychiatric:        Mood and Affect: Mood normal.        Behavior: Behavior normal.        Thought Content: Thought content normal.  Judgment: Judgment normal.     Ortho Exam  Exam today reveals he is quite uncomfortable with being in a sitting position. He is tender over the lumbosacral area.  He does have some symptoms into his right leg with straight leg raising.  Deep tendon reflexes were absent today.  Tender over the SI joints.  Sensation is unchanged and intact to light touch.  Continues to have fairly good strength.   Specialty Comments:  No specialty comments available.  Imaging:  Mr Lumbar Spine W/o Contrast  Result Date: 01/10/2019 CLINICAL DATA:  Low back pain with right leg pain and numbness and weakness. EXAM: MRI LUMBAR SPINE WITHOUT CONTRAST TECHNIQUE: Multiplanar, multisequence MR imaging of the lumbar spine was performed. No intravenous contrast was administered.  COMPARISON:  Lumbar radiographs 01/02/2019 FINDINGS: Segmentation:  Normal Alignment: Mild retrolisthesis L1-2, L2-3, L3-4, L4-5. 5 mm anterolisthesis L5-S1 Vertebrae:  Negative for fracture or mass.  Normal bone marrow. Conus medullaris and cauda equina: Conus extends to the T12-L1 level. Conus and cauda equina appear normal. Paraspinal and other soft tissues: Negative for paraspinous mass or adenopathy. Negative for soft tissue edema or fluid collection. Disc levels: L1-2: Mild disc and mild facet degeneration.  Negative for stenosis L2-3: Mild disc and mild facet degeneration.  Negative for stenosis. L3-4: Disc degeneration with diffuse disc bulging, asymmetric on the left. Moderate subarticular stenosis on the left and mild to moderate subarticular stenosis on the right. Mild spinal stenosis. L4-5: Disc degeneration with diffuse disc bulging and endplate spurring. Bilateral facet hypertrophy. Asymmetric disc bulging and spurring on the right. Moderate to severe subarticular and foraminal stenosis bilaterally. Moderate spinal stenosis. L5-S1: 5 mm anterolisthesis with bilateral pars defects of L5. Bilateral L5 nerve root impingement in the foramen. IMPRESSION: Multilevel degenerative change throughout the lumbar spine. Negative for fracture Mild spinal stenosis L3-4 with moderate subarticular stenosis on the left and mild to moderate subarticular stenosis on the right Moderate spinal stenosis L4-5 with moderate to severe subarticular stenosis bilaterally 5 mm anterolisthesis L5-S1 due to bilateral pars defects of L5. Bilateral L5 nerve root impingement. Electronically Signed   By: Franchot Gallo M.D.   On: 01/10/2019 18:33   Xr Lumbar Spine 2-3 Views  Result Date: 01/02/2019 Three-view x-ray of the lumbar spine reveals degenerative changes in the SI joints bilaterally.  He does have a grade 1 spondylolisthesis L5 on S1 retrolisthesis L4 on 5 this is very mild.  He does have some spurring superior L4  vertebral body.With a request also noted to have some compression of L1 vertebral body.  There is also some degenerative changes at the symphysis pubis.  Facet degenerative changes are noted L4 through sacrum.     PMFS History: Current Outpatient Medications  Medication Sig Dispense Refill  . diazepam (VALIUM) 5 MG tablet Take 1 tablet (5 mg total) by mouth every 6 (six) hours as needed for anxiety (prior to MRI scan). 2 tablet 0  . methylPREDNISolone (MEDROL DOSEPAK) 4 MG TBPK tablet As directed 1 each 0  . NEXIUM 40 MG capsule   2  . omeprazole (PRILOSEC) 20 MG capsule Take 20 mg by mouth daily.    Marland Kitchen oxyCODONE (ROXICODONE) 5 MG immediate release tablet Take 1 tablet (5 mg total) by mouth every 6 (six) hours as needed. 30 tablet 0   No current facility-administered medications for this visit.     Patient Active Problem List   Diagnosis Date Noted  . Spinal stenosis of lumbar region 01/23/2019  .  Lumbago with sciatica, right side 01/02/2019  . Spondylolisthesis, lumbosacral region 01/02/2019  . Retrolisthesis 01/02/2019  . SI joint arthritis 01/02/2019  . Dizziness 09/17/2013   History reviewed. No pertinent past medical history.  Family History  Problem Relation Age of Onset  . Asthma Father     Past Surgical History:  Procedure Laterality Date  . HAND SURGERY Left 2012   thumb fusion  . KNEE ARTHROSCOPY Right 2016   2 x  . KNEE ARTHROSCOPY Left 2004   Social History   Occupational History  . Not on file  Tobacco Use  . Smoking status: Never Smoker  . Smokeless tobacco: Never Used  Substance and Sexual Activity  . Alcohol use: No  . Drug use: No  . Sexual activity: Not on file

## 2019-02-18 ENCOUNTER — Ambulatory Visit: Payer: 59 | Admitting: Orthopaedic Surgery

## 2019-02-27 ENCOUNTER — Encounter: Payer: 59 | Admitting: Physical Medicine and Rehabilitation

## 2021-02-13 IMAGING — MR MR LUMBAR SPINE W/O CM
4 of 5 series · 18 of 48 positions shown · non-contrast
Comparison: Lumbar radiographs 01/02/2019

CLINICAL DATA: Low back pain with right leg pain and numbness and
weakness.

EXAM:
MRI LUMBAR SPINE WITHOUT CONTRAST
TECHNIQUE: Multiplanar, multisequence MR imaging of the lumbar spine was
performed. No intravenous contrast was administered.

[Series 6: T2 · sagittal · 4.0mm · 0.73mm/px · 6 of 18 slices shown (1 of 2)]
[im 1/18]
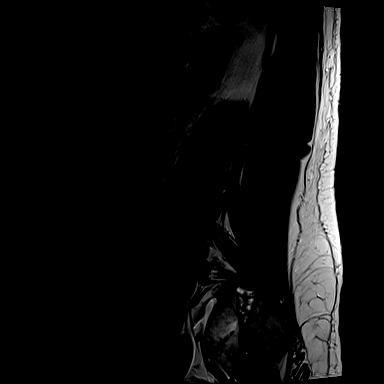
[im 4/18]
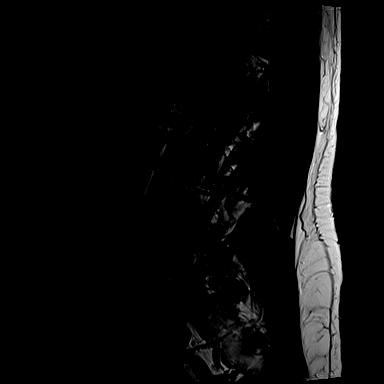
[im 7/18]
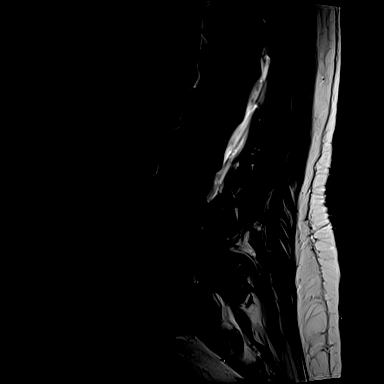
[im 11/18]
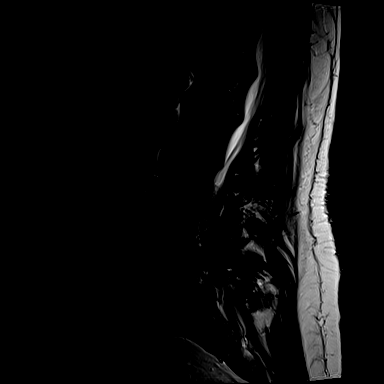
[im 14/18]
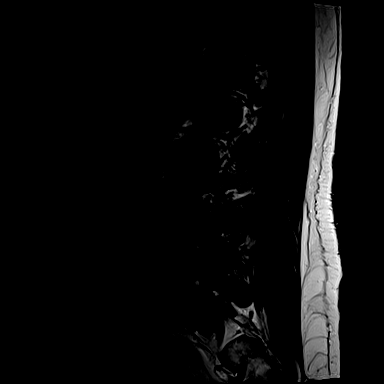
[im 18/18]
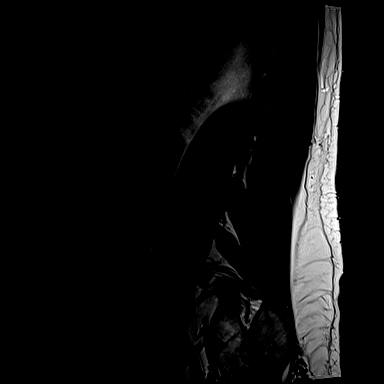

[Series 7: T1 · sagittal · 4.0mm · 0.88mm/px · 3 of 18 slices shown (1 of 2)]
[im 4/18]
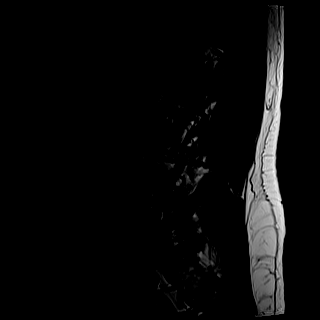
[im 11/18]
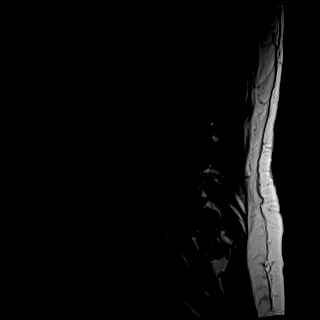
[im 18/18]
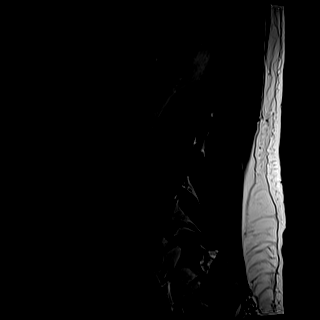

[Series 12: T1 · axial · 4.0mm · 0.28mm/px · z∈[-62,+114]mm · 3 of 42 slices shown (2 of 2)]
[im 6/42]
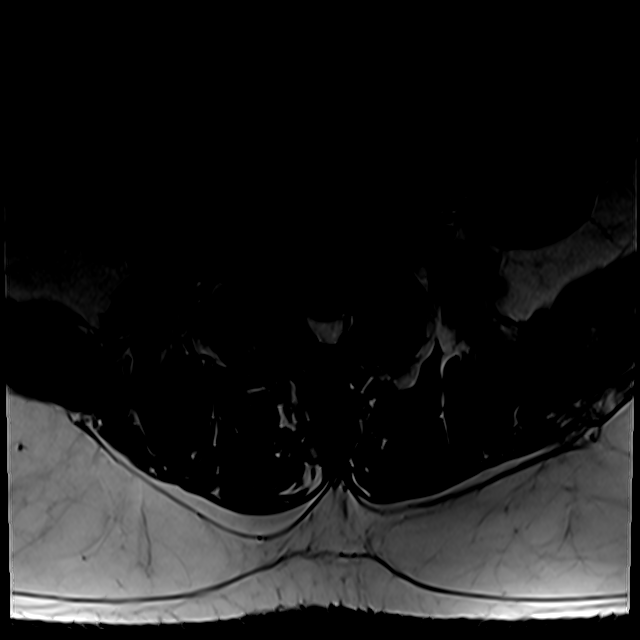
[im 21/42]
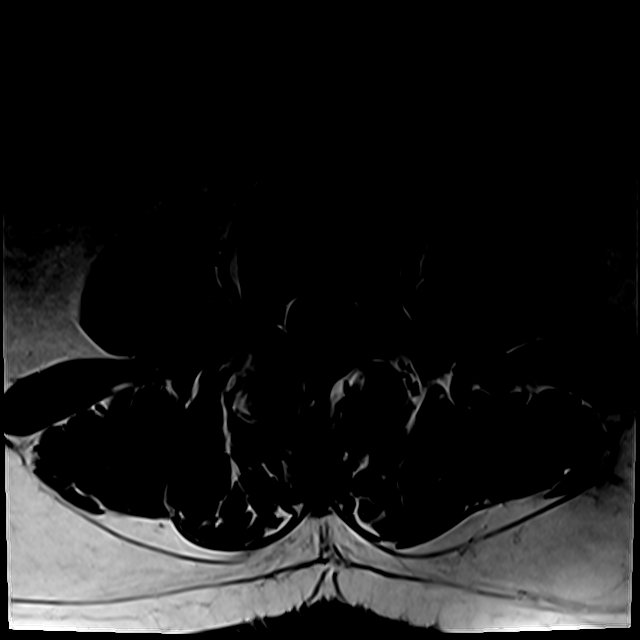
[im 36/42]
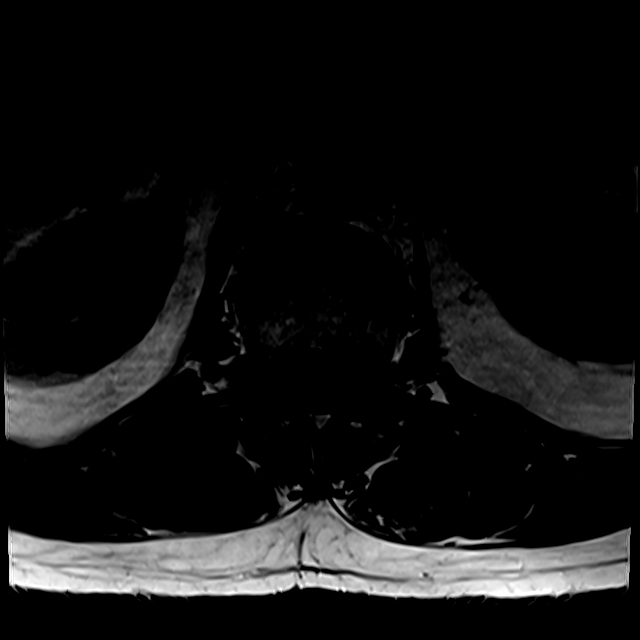

[Series 15: T2 · axial · 4.0mm · 0.28mm/px · z∈[-86,+114]mm · 6 of 42 slices shown (2 of 2)]
[im 1/42]
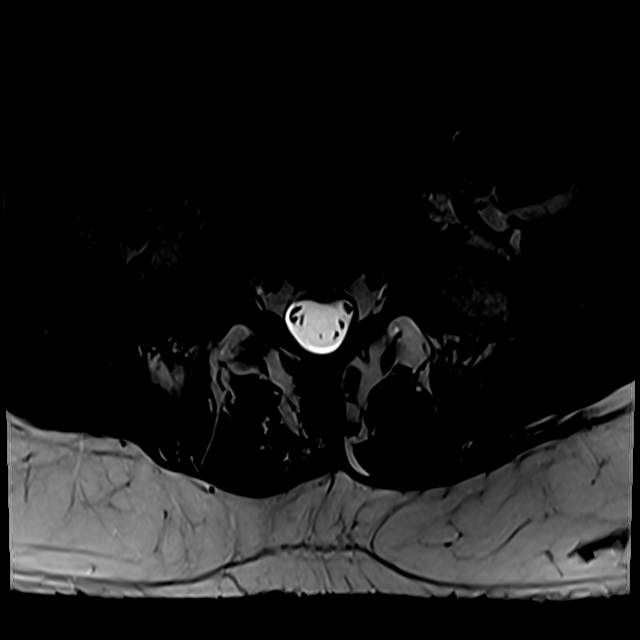
[im 6/42]
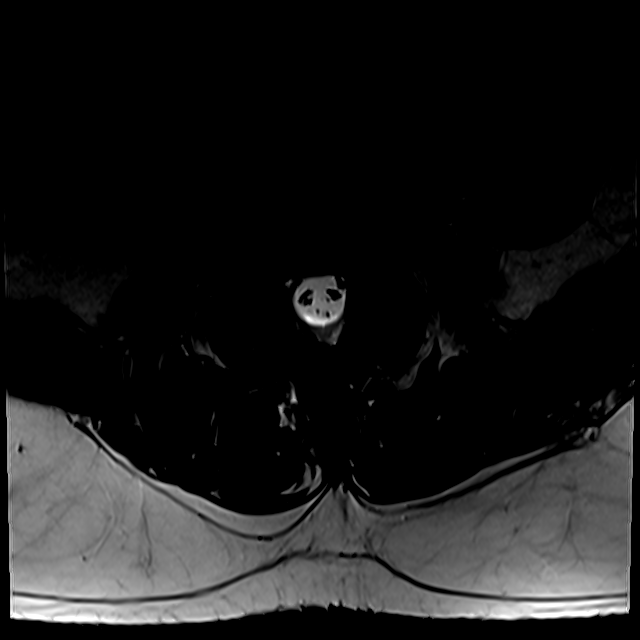
[im 12/42]
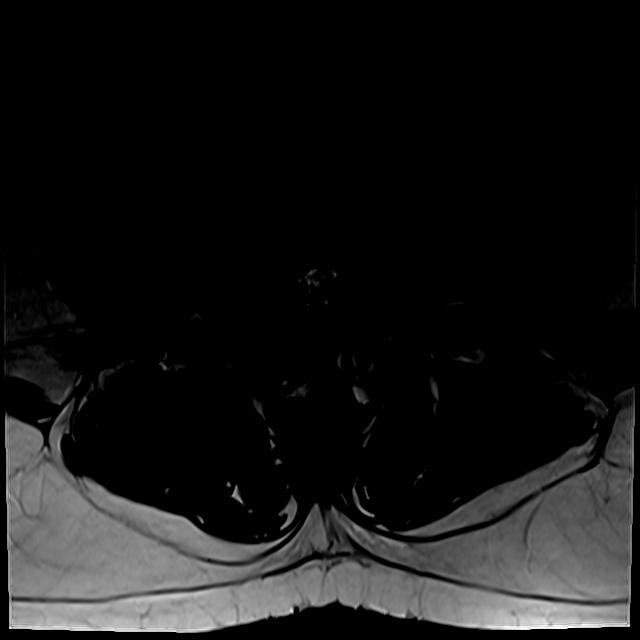
[im 18/42]
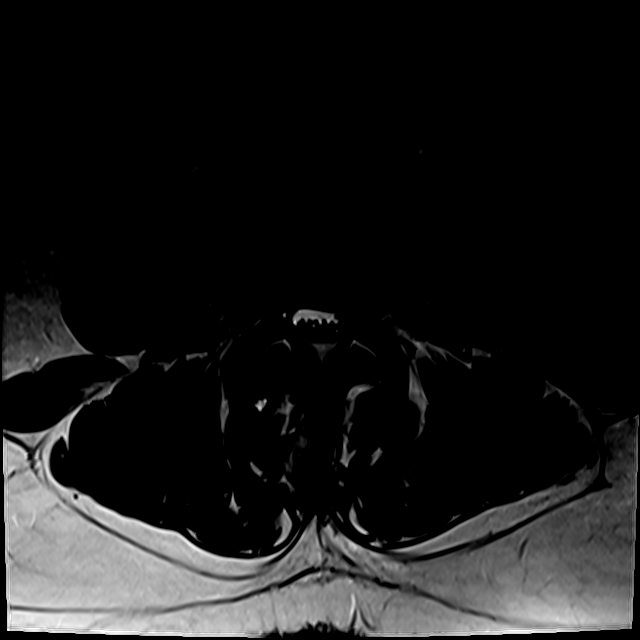
[im 21/42]
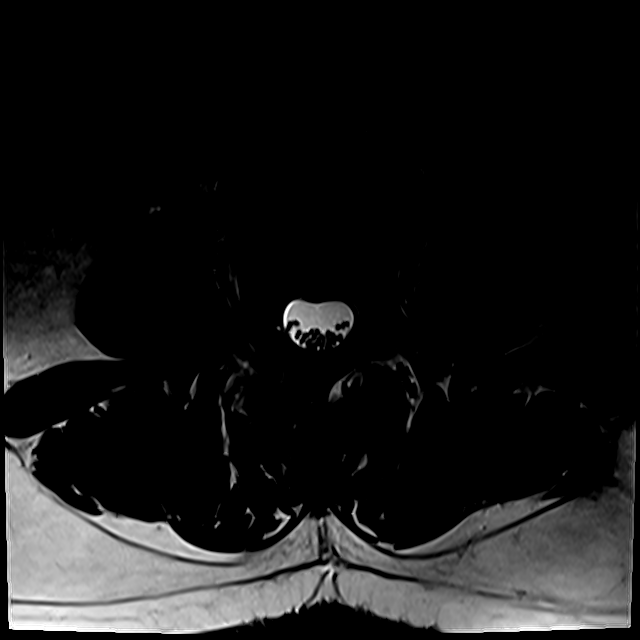
[im 36/42]
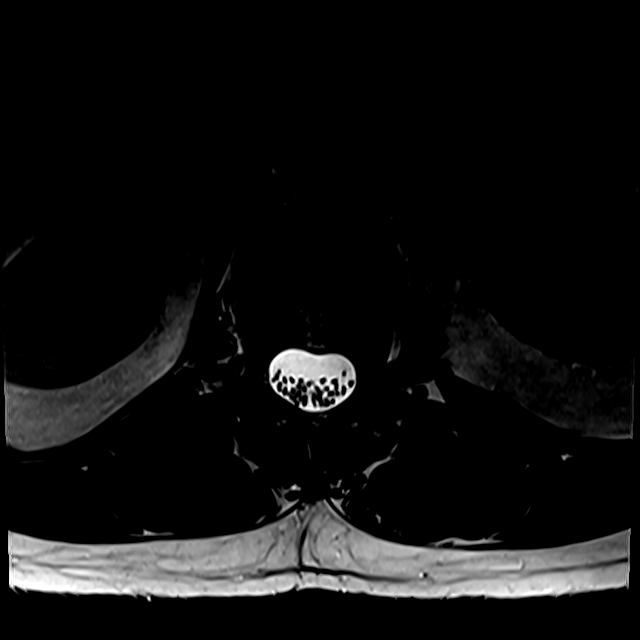

[18 of 48 positions shown; findings below may reference images not displayed]

FINDINGS: Segmentation:  Normal

Alignment: Mild retrolisthesis L1-2, L2-3, L3-4, L4-5. 5 mm
anterolisthesis L5-S1

Vertebrae:  Negative for fracture or mass.  Normal bone marrow.

Conus medullaris and cauda equina: Conus extends to the T12-L1
level. Conus and cauda equina appear normal.

Paraspinal and other soft tissues: Negative for paraspinous mass or
adenopathy. Negative for soft tissue edema or fluid collection.

Disc levels:

L1-2: Mild disc and mild facet degeneration.  Negative for stenosis

L2-3: Mild disc and mild facet degeneration.  Negative for stenosis.

L3-4: Disc degeneration with diffuse disc bulging, asymmetric on the
left. Moderate subarticular stenosis on the left and mild to
moderate subarticular stenosis on the right. Mild spinal stenosis.

L4-5: Disc degeneration with diffuse disc bulging and endplate
spurring. Bilateral facet hypertrophy. Asymmetric disc bulging and
spurring on the right. Moderate to severe subarticular and foraminal
stenosis bilaterally. Moderate spinal stenosis.

L5-S1: 5 mm anterolisthesis with bilateral pars defects of L5.
Bilateral L5 nerve root impingement in the foramen.
IMPRESSION: Multilevel degenerative change throughout the lumbar spine. Negative
for fracture

Mild spinal stenosis L3-4 with moderate subarticular stenosis on the
left and mild to moderate subarticular stenosis on the right

Moderate spinal stenosis L4-5 with moderate to severe subarticular
stenosis bilaterally

5 mm anterolisthesis L5-S1 due to bilateral pars defects of L5.
Bilateral L5 nerve root impingement.

## 2022-08-14 ENCOUNTER — Ambulatory Visit: Payer: 59 | Admitting: Podiatry

## 2022-09-18 ENCOUNTER — Ambulatory Visit: Payer: 59 | Admitting: Podiatry
